# Patient Record
Sex: Male | Born: 1965 | Race: White | Hispanic: No | Marital: Married | Smoking: Never smoker
Health system: Southern US, Community
[De-identification: ages and names within clinical notes are randomized; demographics above are authoritative.]

## PROBLEM LIST (undated history)

## (undated) DIAGNOSIS — E7800 Pure hypercholesterolemia, unspecified: Secondary | ICD-10-CM

## (undated) DIAGNOSIS — F419 Anxiety disorder, unspecified: Secondary | ICD-10-CM

---

## 2019-08-16 ENCOUNTER — Emergency Department
Admission: EM | Admit: 2019-08-16 | Discharge: 2019-08-16 | Disposition: A | Discharge status: Home / Self Care | Payer: BC Managed Care – PPO | Attending: Emergency Medicine | Admitting: Emergency Medicine

## 2019-08-16 ENCOUNTER — Emergency Department: Payer: BC Managed Care – PPO

## 2019-08-16 ENCOUNTER — Other Ambulatory Visit: Payer: Self-pay

## 2019-08-16 ENCOUNTER — Encounter: Payer: Self-pay | Admitting: Emergency Medicine

## 2019-08-16 DIAGNOSIS — R079 Chest pain, unspecified: Secondary | ICD-10-CM

## 2019-08-16 DIAGNOSIS — F419 Anxiety disorder, unspecified: Secondary | ICD-10-CM | POA: Diagnosis not present

## 2019-08-16 DIAGNOSIS — R0789 Other chest pain: Secondary | ICD-10-CM | POA: Diagnosis not present

## 2019-08-16 DIAGNOSIS — R42 Dizziness and giddiness: Secondary | ICD-10-CM | POA: Diagnosis not present

## 2019-08-16 HISTORY — DX: Pure hypercholesterolemia, unspecified: E78.00

## 2019-08-16 HISTORY — DX: Anxiety disorder, unspecified: F41.9

## 2019-08-16 LAB — CBC
HCT: 42.7 % (ref 39.0–52.0)
Hemoglobin: 14.2 g/dL (ref 13.0–17.0)
MCH: 30.4 pg (ref 26.0–34.0)
MCHC: 33.3 g/dL (ref 30.0–36.0)
MCV: 91.4 fL (ref 80.0–100.0)
Platelets: 362 10*3/uL (ref 150–400)
RBC: 4.67 MIL/uL (ref 4.22–5.81)
RDW: 12.5 % (ref 11.5–15.5)
WBC: 8.6 10*3/uL (ref 4.0–10.5)
nRBC: 0 % (ref 0.0–0.2)

## 2019-08-16 LAB — BASIC METABOLIC PANEL
Anion gap: 8 (ref 5–15)
BUN: 11 mg/dL (ref 6–20)
CO2: 25 mmol/L (ref 22–32)
Calcium: 9.2 mg/dL (ref 8.9–10.3)
Chloride: 106 mmol/L (ref 98–111)
Creatinine, Ser: 1.22 mg/dL (ref 0.61–1.24)
GFR calc Af Amer: >60 mL/min (ref >60)
GFR calc non Af Amer: >60 mL/min (ref >60)
Glucose, Bld: 101 mg/dL — ABNORMAL HIGH (ref 70–99)
Potassium: 3.6 mmol/L (ref 3.5–5.1)
Sodium: 139 mmol/L (ref 135–145)

## 2019-08-16 LAB — TROPONIN I (HIGH SENSITIVITY): Troponin I (High Sensitivity): 4 ng/L (ref <18)

## 2019-08-16 MED ORDER — SODIUM CHLORIDE 0.9% FLUSH: 3.0000 mL | Freq: Once | INTRAVENOUS | Status: DC

## 2019-08-16 NOTE — Discharge Instructions (Signed)
Your cardiac marker was reassuring.  Your EKG signs of some right axis deviation which can be normal but you can return to the ER if you develop shortness of breath, leg swelling.  Otherwise follow-up with your primary doctor.  Also given you cardiology's number for follow-up.

## 2019-08-16 NOTE — ED Triage Notes (Signed)
Pt presents to ED via POV with c/o intermittent CP and dizziness since this morning. Pt reports that pain has resolved, however reports when he had pain it was central chest, denies radiation, rated as a 4-5/10, describes as a heaviness. Pt reports multiple stressors at work.

## 2019-08-16 NOTE — ED Provider Notes (Signed)
[facility] Emergency Department Provider Note  ____________________________________________   First MD Initiated Contact with Patient 08/16/19 1937     (approximate)  I have reviewed the triage vital signs and the nursing notes.   HISTORY  Chief Complaint Chest Pain and Dizziness    HPI Matthew Brock is a 54 y.o. male with anxiety who comes in for some chest pain.  Patient states that he has been working 70 hours a week as work for the past over 10 days.  Patient states that he had some chest discomfort that started around 730 last a few minutes, across the top of his chest, nonradiating.  There is associated with a little bit of catching his breath and feeling a little dizzy.Matthew Brock  No vomiting, abdominal pain.  The pain resolved on its own.  Had another episode around 830 and another episode around 86.  Patient stated that he did eat something and did not notice it get worse or get better.  States that he has been under a lot of stress recently has not been getting a lot of sleep so was thinking it was more likely related to that but wanted to make sure that it was not related to his heart.  He he denies any other risk factors for PE.  Patient states that his symptoms have now resolved.  He does not smoke.          Past Medical History:  Diagnosis Date  . Anxiety   . High cholesterol     There are no problems to display for this patient.   History reviewed. No pertinent surgical history.  Prior to Admission medications   Not on File    Allergies Patient has no known allergies.  No family history on file.  Social History Social History   Tobacco Use  . Smoking status: Never Smoker  . Smokeless tobacco: Never Used  Substance Use Topics  . Alcohol use: Not Currently  . Drug use: Never      Review of Systems Constitutional: No fever/chills, dizzy Eyes: No visual changes. ENT: No sore throat. Cardiovascular: Positive chest  pain Respiratory: Catching his breath Gastrointestinal: No abdominal pain.  No nausea, no vomiting.  No diarrhea.  No constipation. Genitourinary: Negative for dysuria. Musculoskeletal: Negative for back pain. Skin: Negative for rash. Neurological: Negative for headaches, focal weakness or numbness. All other ROS negative ____________________________________________   PHYSICAL EXAM:  VITAL SIGNS: ED Triage Vitals  Enc Vitals Group     BP 08/16/19 1625 (!) 130/94     Pulse Rate 08/16/19 1625 72     Resp 08/16/19 1625 20     Temp 08/16/19 1625 98.2 F (36.8 C)     Temp Source 08/16/19 1625 Oral     SpO2 08/16/19 1625 97 %     Weight 08/16/19 1626 208 lb (94.3 kg)     Height 08/16/19 1626 5\' 9"  (1.753 m)     Head Circumference --      Peak Flow --      Pain Score 08/16/19 1626 0     Pain Loc --      Pain Edu? --      Excl. in [place]? --     Constitutional: Alert and oriented. Well appearing and in no acute distress. Eyes: Conjunctivae are normal. EOMI. Head: Atraumatic. Nose: No congestion/rhinnorhea. Mouth/Throat: Mucous membranes are moist.   Neck: No stridor. Trachea Midline. FROM Cardiovascular: Normal rate, regular rhythm. Grossly normal heart sounds.  Good peripheral circulation. Respiratory: Normal respiratory effort.  No retractions. Lungs CTAB. Gastrointestinal: Soft and nontender. No distention. No abdominal bruits.  Musculoskeletal: No lower extremity tenderness nor edema.  No joint effusions. Neurologic:  Normal speech and language. No gross focal neurologic deficits are appreciated.  Skin:  Skin is warm, dry and intact. No rash noted. Psychiatric: Mood and affect are normal. Speech and behavior are normal. GU: Deferred   ____________________________________________   LABS (all labs ordered are listed, but only abnormal results are displayed)  Labs Reviewed  BASIC METABOLIC PANEL - Abnormal; Notable for the following components:      Result Value    Glucose, Bld 101 (*)    All other components within normal limits  CBC  TROPONIN I (HIGH SENSITIVITY)   ____________________________________________   ED ECG REPORT I, Concha Se, the attending physician, personally viewed and interpreted this ECG.  EKG shows normal sinus rate of 90, no ST elevation, does have little T wave inversion in lead III, maybe little right axis deviation, normal intervals ____________________________________________  RADIOLOGY I, Concha Se, personally viewed and evaluated these images (plain radiographs) as part of my medical decision making, as well as reviewing the written report by the radiologist.  ED MD interpretation: No pneumonia  Official radiology report(s): DG Chest 2 View  Result Date: 08/16/2019 CLINICAL DATA:  54 year old male with chest pain. EXAM: CHEST - 2 VIEW COMPARISON:  None. FINDINGS: The lungs are clear. There is no pleural effusion or pneumothorax. The cardiac silhouette is within normal limits. No acute osseous pathology. IMPRESSION: No active cardiopulmonary disease. Electronically Signed   By: Elgie Collard M.D.   On: 08/16/2019 17:20    ____________________________________________   PROCEDURES  Procedure(s) performed (including Critical Care):  Procedures   ____________________________________________   INITIAL IMPRESSION / ASSESSMENT AND PLAN / ED COURSE   Matthew Brock was evaluated in Emergency Department on 08/16/2019 for the symptoms described in the history of present illness. He was evaluated in the context of the global COVID-19 pandemic, which necessitated consideration that the patient might be at risk for infection with the SARS-CoV-2 virus that causes COVID-19. Institutional protocols and algorithms that pertain to the evaluation of patients at risk for COVID-19 are in a state of rapid change based on information released by regulatory bodies including the CDC and federal and state organizations. These  policies and algorithms were followed during the patient's care in the ED.    Most Likely DDx:  -MSK (atypical chest pain) but will get cardiac markers to evaluate for ACS given risk factors/age   DDx that was also considered d/t potential to cause harm, but was found less likely based on history and physical (as detailed above): -PNA (no fevers, cough but CXR to evaluate) -PNX (reassured with equal b/l breath sounds, CXR to evaluate) -Symptomatic anemia (will get H&H) -Pulmonary embolism as no sob at rest, not pleuritic in nature, no hypoxia.  No risk factors for PE. -Aortic Dissection as no tearing pain and no radiation to the mid back, pulses equal -Pericarditis no rub on exam, EKG changes or hx to suggest dx -Tamponade (no notable SOB, tachycardic, hypotensive) -Esophageal rupture (no h/o diffuse vomitting/no crepitus)  Patient does have a little bit of right axis deviation on his EKG.  I thought about this being a PE but patient symptoms have completely resolved.  He did have a little bit of catching his breath but not really any shortness of breath.  Patient looks very comfortable  denies any long travel, leg swelling, recent surgery.  He is very low risk for PE.  He has no shortness of breath or pleuritic chest pain.  Given his symptoms have since resolved I think we can hold off on further testing but patient understands that he needs to return if he develops worsening shortness of breath.  I did discuss this finding with patient and he is agreeable with holding off on further testing given his symptoms have resolved and understands fully returning if he develops shortness of breath.  Labs are reassuring with negative troponin at 430.  Patient symptoms have been going on greater than 3 hours at that time.  Patient has a low heart score of less than 3.  Discussed with patient and we do not need to repeat troponin.   We will have patient follow-up with cardiology and his primary care doctor.   Patient provided a work note to get  some rest for the next 2 days.  I discussed the provisional nature of ED diagnosis, the treatment so far, the ongoing plan of care, follow up appointments and return precautions with the patient and any family or support people present. They expressed understanding and agreed with the plan, discharged home.  ____________________________________________   FINAL CLINICAL IMPRESSION(S) / ED DIAGNOSES   Final diagnoses:  Chest pain, unspecified type     MEDICATIONS GIVEN DURING THIS VISIT:  Medications  sodium chloride flush (NS) 0.9 % injection 3 mL (3 mLs Intravenous Not Given 08/16/19 1926)     ED Discharge Orders    None       Note:  This document was prepared using Dragon voice recognition software and may include unintentional dictation errors.   Concha Se, MD 08/16/19 2018

## 2019-08-26 ENCOUNTER — Ambulatory Visit: Payer: BC Managed Care – PPO | Admitting: Cardiology

## 2021-03-07 ENCOUNTER — Other Ambulatory Visit: Payer: Self-pay | Admitting: Internal Medicine

## 2021-03-07 DIAGNOSIS — M5416 Radiculopathy, lumbar region: Secondary | ICD-10-CM

## 2021-03-14 ENCOUNTER — Ambulatory Visit: Admission: RE | Admit: 2021-03-14 | Payer: BC Managed Care – PPO | Source: Ambulatory Visit

## 2021-03-15 ENCOUNTER — Other Ambulatory Visit: Payer: Self-pay | Admitting: Internal Medicine

## 2021-03-15 DIAGNOSIS — M5416 Radiculopathy, lumbar region: Secondary | ICD-10-CM

## 2021-03-27 ENCOUNTER — Ambulatory Visit
Admission: RE | Admit: 2021-03-27 | Discharge: 2021-03-27 | Disposition: A | Discharge status: Home / Self Care | Payer: BC Managed Care – PPO | Source: Ambulatory Visit | Attending: Internal Medicine | Admitting: Internal Medicine

## 2021-03-27 DIAGNOSIS — M5416 Radiculopathy, lumbar region: Secondary | ICD-10-CM

## 2023-05-22 IMAGING — MR MR LUMBAR SPINE W/O CM
4 of 5 series · 18 of 48 positions shown · non-contrast
Comparison: None.

CLINICAL DATA: Right low back and buttocks pain for 4 weeks with
numbness.

EXAM:
MRI LUMBAR SPINE WITHOUT CONTRAST
TECHNIQUE: Multiplanar, multisequence MR imaging of the lumbar spine was
performed. No intravenous contrast was administered.

[Series 5: T2 · sagittal · 4.0mm · 0.73mm/px · 6 of 17 slices shown (1 of 2)]
[im 1/17]
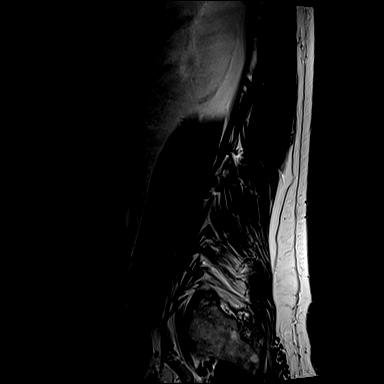
[im 4/17]
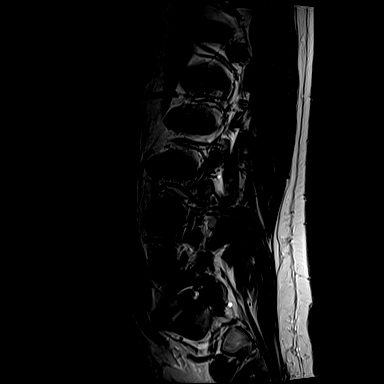
[im 7/17]
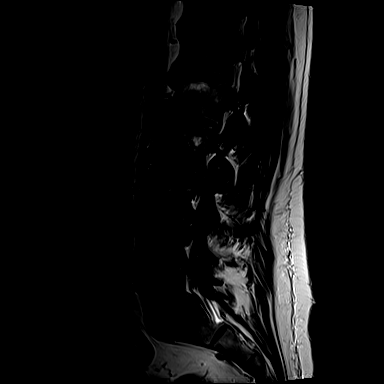
[im 10/17]
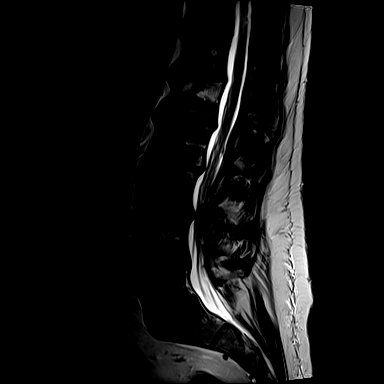
[im 13/17]
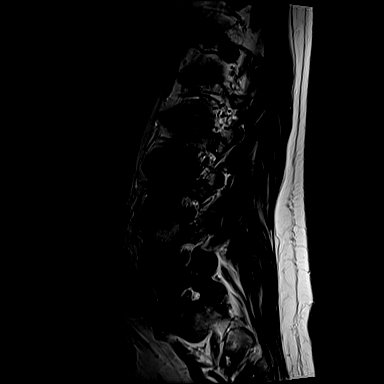
[im 17/17]
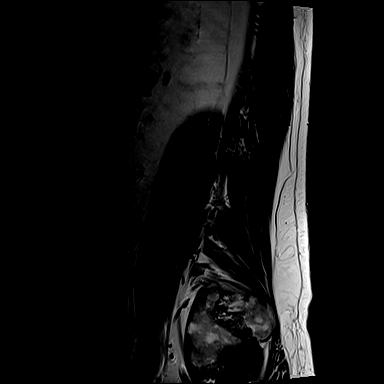

[Series 6: T1 · sagittal · 4.0mm · 0.88mm/px · 3 of 17 slices shown (1 of 2)]
[im 4/17]
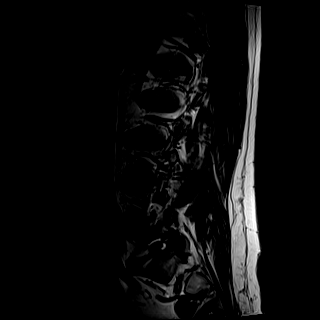
[im 10/17]
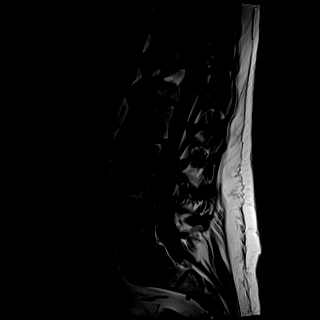
[im 17/17]
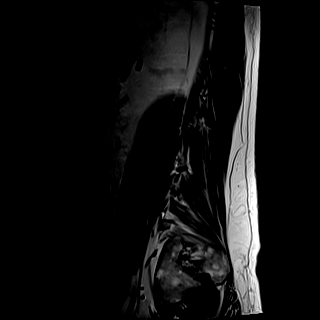

[Series 10: T1 · axial · 4.0mm · 0.28mm/px · z∈[-79,+86]mm · 3 of 44 slices shown (2 of 2)]
[im 7/44]
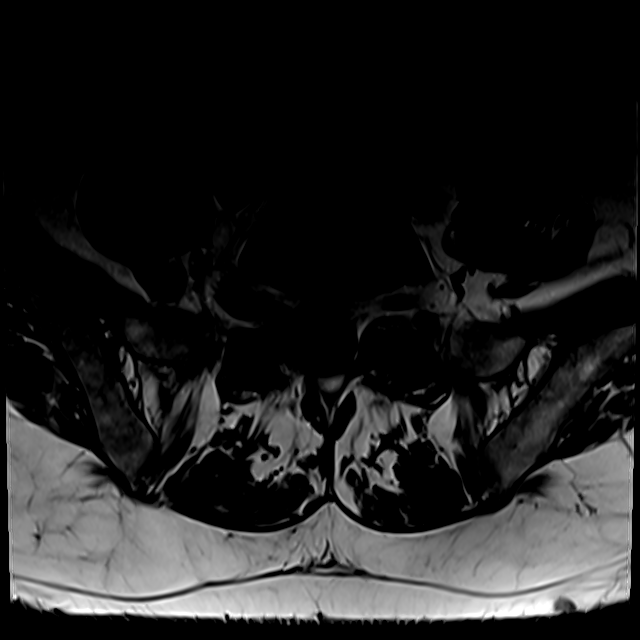
[im 22/44]
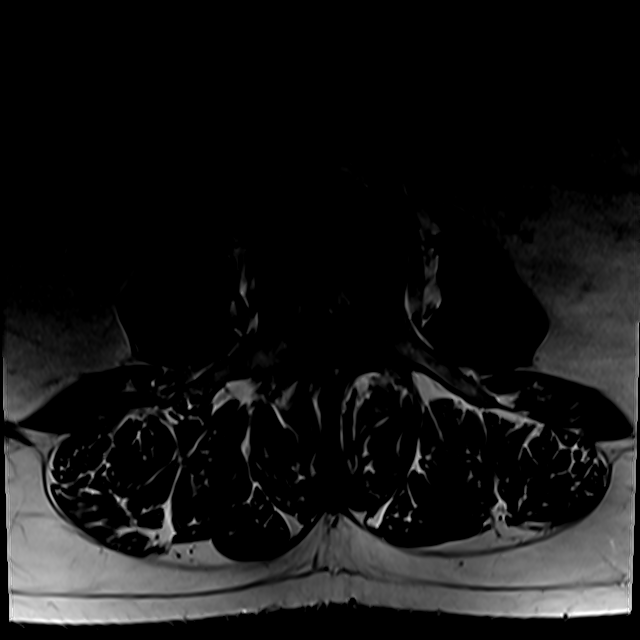
[im 37/44]
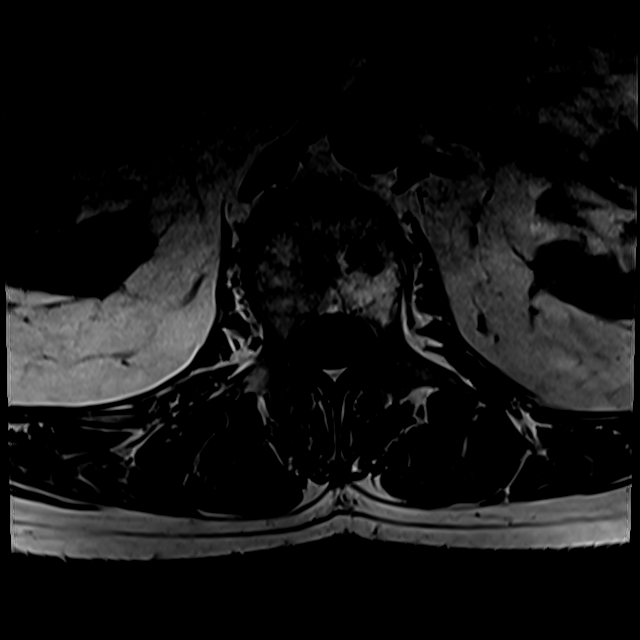

[Series 13: T2 · axial · 4.0mm · 0.28mm/px · z∈[-109,+86]mm · 6 of 44 slices shown (2 of 2)]
[im 1/44]
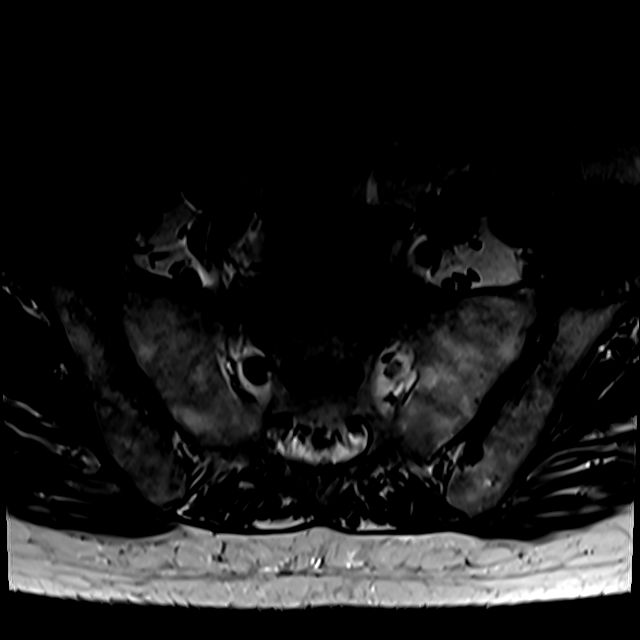
[im 7/44]
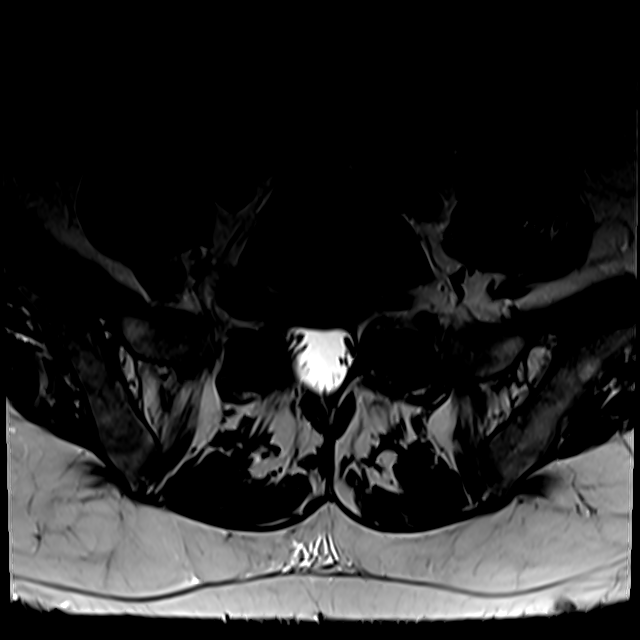
[im 13/44]
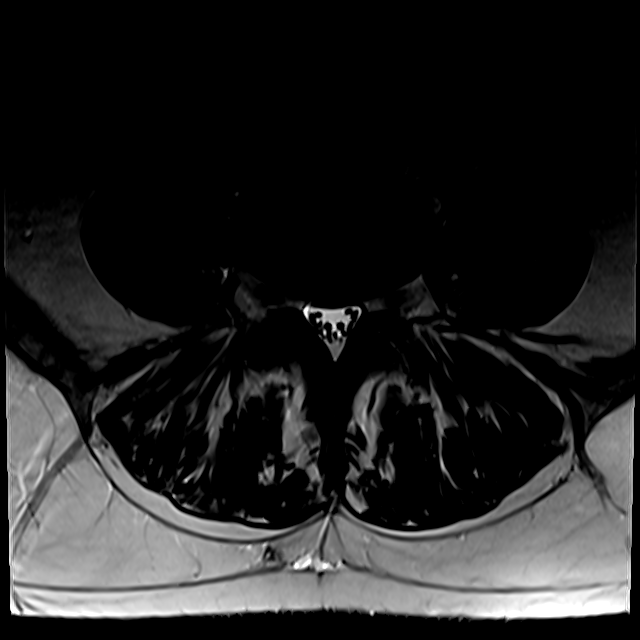
[im 19/44]
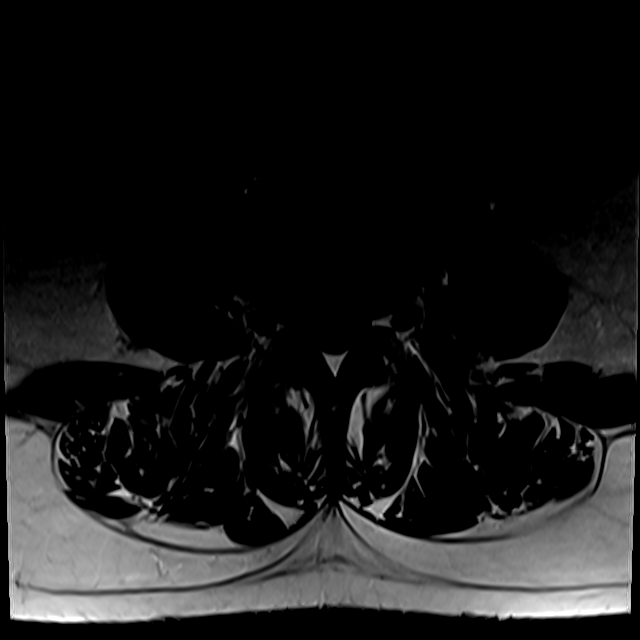
[im 22/44]
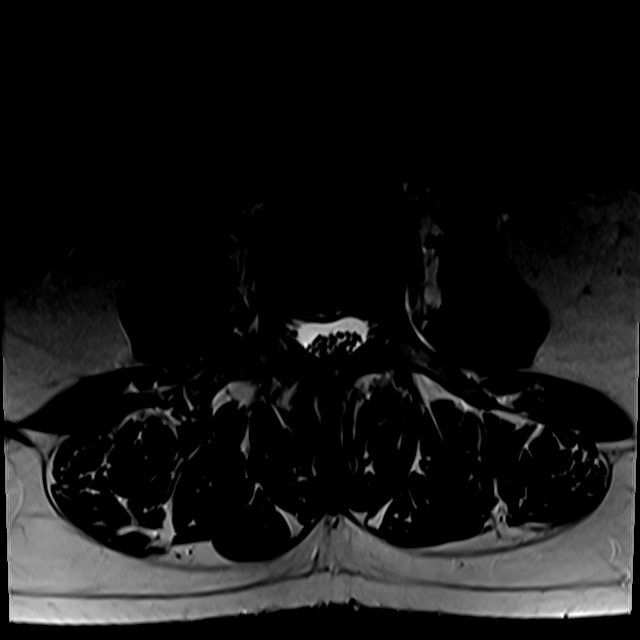
[im 37/44]
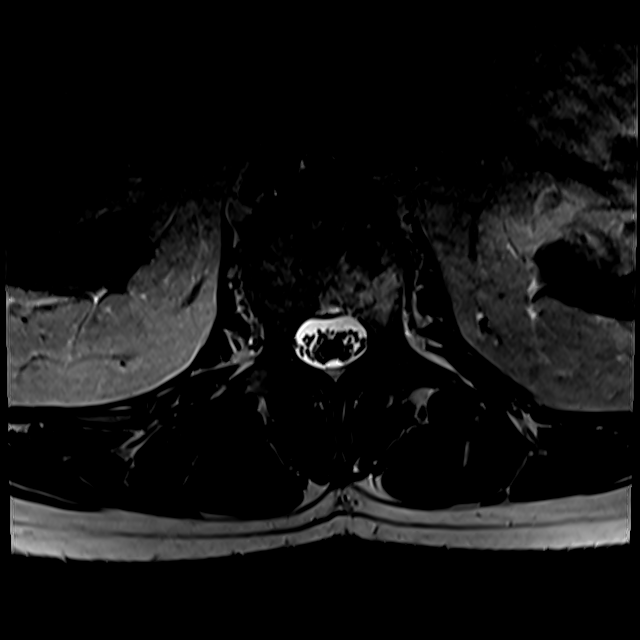

[18 of 48 positions shown; findings below may reference images not displayed]

FINDINGS: Segmentation:  Standard.

Alignment: There is 3 mm retrolisthesis of L2 on L3. Trace
retrolisthesis at L1-2, L3-4, and L4-5.

Vertebrae: Superior endplate Schmorl's nodes with mild endplate
depressions at L1 and L4. No associated bone marrow edema. No
evidence of acute fracture. No evidence of discitis. No suspicious
marrow replacing bone lesion.

Conus medullaris and cauda equina: Conus extends to the L1-2 level.
Conus and cauda equina appear normal.

Paraspinal and other soft tissues: Negative.

Disc levels:

T12-L1: Mild disc bulge.  No foraminal or canal stenosis.

L1-L2: Mild diffuse disc bulge resulting in mild canal stenosis. No
foraminal stenosis.

L2-L3: Mild diffuse disc bulge with tiny left paracentral
protrusion. Mild bilateral facet hypertrophy. Mild-moderate canal
stenosis. No significant foraminal stenosis.

L3-L4: Diffuse disc bulge with superimposed right foraminal disc
protrusion. Mild bilateral facet hypertrophy. Moderate canal
stenosis and mild bilateral foraminal stenosis.

L4-L5: Minimal annular disc bulge. Mild bilateral facet hypertrophy.
No foraminal or canal stenosis.

L5-S1: Mild annular disc bulge. Moderate bilateral facet
arthropathy. Mild bilateral foraminal stenosis without canal
stenosis.
IMPRESSION: 1. Mild-to-moderate multilevel lumbar spondylosis, most pronounced
at the L3-4 level where there is moderate canal stenosis and mild
bilateral foraminal stenosis.
2. Mild-to-moderate canal stenosis at L2-L3 and mild canal stenosis
at L1-L2.

## 2023-10-15 ENCOUNTER — Ambulatory Visit

## 2023-10-15 DIAGNOSIS — D120 Benign neoplasm of cecum: Secondary | ICD-10-CM | POA: Diagnosis not present

## 2023-10-15 DIAGNOSIS — D124 Benign neoplasm of descending colon: Secondary | ICD-10-CM | POA: Diagnosis not present

## 2023-10-15 DIAGNOSIS — D122 Benign neoplasm of ascending colon: Secondary | ICD-10-CM | POA: Diagnosis not present

## 2023-10-15 DIAGNOSIS — Z1211 Encounter for screening for malignant neoplasm of colon: Secondary | ICD-10-CM | POA: Diagnosis present

## 2023-10-15 DIAGNOSIS — K5730 Diverticulosis of large intestine without perforation or abscess without bleeding: Secondary | ICD-10-CM | POA: Diagnosis not present

## 2023-10-15 DIAGNOSIS — D123 Benign neoplasm of transverse colon: Secondary | ICD-10-CM | POA: Diagnosis not present

## 2023-10-15 DIAGNOSIS — Z83719 Family history of colon polyps, unspecified: Secondary | ICD-10-CM | POA: Diagnosis not present
